# Patient Record
Sex: Female | Born: 1980 | Race: White | Hispanic: No | Marital: Married | Smoking: Never smoker
Health system: Southern US, Community
[De-identification: ages and names within clinical notes are randomized; demographics above are authoritative.]

## PROBLEM LIST (undated history)

## (undated) DIAGNOSIS — Z789 Other specified health status: Secondary | ICD-10-CM

## (undated) HISTORY — PX: GANGLION CYST EXCISION: SHX1691

---
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
## 2001-07-09 ENCOUNTER — Inpatient Hospital Stay (HOSPITAL_COMMUNITY): Admission: EM | Admit: 2001-07-09 | Discharge: 2001-07-11 | Payer: Self-pay | Admitting: *Deleted

## 2001-07-09 ENCOUNTER — Encounter: Payer: Self-pay | Admitting: Emergency Medicine

## 2001-08-28 ENCOUNTER — Encounter: Payer: Self-pay | Admitting: Emergency Medicine

## 2001-08-28 ENCOUNTER — Emergency Department (HOSPITAL_COMMUNITY): Admission: EM | Admit: 2001-08-28 | Discharge: 2001-08-28 | Payer: Self-pay | Admitting: Emergency Medicine

## 2001-09-29 ENCOUNTER — Emergency Department (HOSPITAL_COMMUNITY): Admission: EM | Admit: 2001-09-29 | Discharge: 2001-09-29 | Payer: Self-pay | Admitting: Emergency Medicine

## 2001-09-30 ENCOUNTER — Emergency Department (HOSPITAL_COMMUNITY): Admission: EM | Admit: 2001-09-30 | Discharge: 2001-09-30 | Payer: Self-pay | Admitting: Emergency Medicine

## 2001-10-11 ENCOUNTER — Emergency Department (HOSPITAL_COMMUNITY): Admission: EM | Admit: 2001-10-11 | Discharge: 2001-10-12 | Payer: Self-pay | Admitting: *Deleted

## 2002-09-04 ENCOUNTER — Other Ambulatory Visit: Admission: RE | Admit: 2002-09-04 | Discharge: 2002-09-04 | Payer: Self-pay | Admitting: Family Medicine

## 2002-11-22 ENCOUNTER — Other Ambulatory Visit: Admission: RE | Admit: 2002-11-22 | Discharge: 2002-11-22 | Payer: Self-pay | Admitting: Obstetrics and Gynecology

## 2002-12-04 ENCOUNTER — Other Ambulatory Visit: Admission: RE | Admit: 2002-12-04 | Discharge: 2002-12-04 | Payer: Self-pay | Admitting: Obstetrics and Gynecology

## 2003-09-13 ENCOUNTER — Encounter: Admission: RE | Admit: 2003-09-13 | Discharge: 2003-09-13 | Payer: Self-pay | Admitting: *Deleted

## 2004-12-07 ENCOUNTER — Other Ambulatory Visit: Admission: RE | Admit: 2004-12-07 | Discharge: 2004-12-07 | Payer: Self-pay | Admitting: Family Medicine

## 2005-05-27 ENCOUNTER — Encounter: Admission: RE | Admit: 2005-05-27 | Discharge: 2005-05-27 | Payer: Self-pay | Admitting: Family Medicine

## 2005-06-09 ENCOUNTER — Encounter: Admission: RE | Admit: 2005-06-09 | Discharge: 2005-06-09 | Payer: Self-pay | Admitting: Sports Medicine

## 2006-01-18 HISTORY — PX: FOOT SURGERY: SHX648

## 2006-06-23 ENCOUNTER — Encounter: Admission: RE | Admit: 2006-06-23 | Discharge: 2006-06-23 | Payer: Self-pay | Admitting: *Deleted

## 2006-10-15 ENCOUNTER — Emergency Department (HOSPITAL_COMMUNITY): Admission: EM | Admit: 2006-10-15 | Discharge: 2006-10-15 | Payer: Self-pay | Admitting: Emergency Medicine

## 2009-08-18 ENCOUNTER — Encounter: Admission: RE | Admit: 2009-08-18 | Discharge: 2009-08-18 | Payer: Self-pay | Admitting: Gastroenterology

## 2009-11-14 ENCOUNTER — Emergency Department (HOSPITAL_COMMUNITY)
Admission: EM | Admit: 2009-11-14 | Discharge: 2009-11-14 | Payer: Self-pay | Source: Home / Self Care | Admitting: Emergency Medicine

## 2010-01-18 NOTE — L&D Delivery Note (Signed)
Delivery Note At 6:16 AM a viable female was delivered via Vaginal, Spontaneous Delivery (Presentation: Left Occiput Anterior).  APGAR: 8, 9; weight 8 lb 8 oz (3856 g).   Placenta status: Intact, Spontaneous.  Cord: 3 vessels.  Anesthesia: Epidural  Episiotomy: None Lacerations: 1st degree at the introitus on the left. Suture Repair: 3.0 chromic Est. Blood Loss (mL): 300  Mom to postpartum.  Baby to nursery-stable.  Domique Clapper D 12/05/2010, 8:16 AM

## 2010-04-01 LAB — CBC
HCT: 38.2 % (ref 36.0–46.0)
MCH: 30.5 pg (ref 26.0–34.0)
MCV: 91.8 fL (ref 78.0–100.0)
RDW: 12.4 % (ref 11.5–15.5)

## 2010-04-01 LAB — URINALYSIS, ROUTINE W REFLEX MICROSCOPIC
Bilirubin Urine: NEGATIVE
Glucose, UA: NEGATIVE mg/dL
Hgb urine dipstick: NEGATIVE
Ketones, ur: NEGATIVE mg/dL
Protein, ur: NEGATIVE mg/dL
pH: 7 (ref 5.0–8.0)

## 2010-04-01 LAB — WET PREP, GENITAL
Trich, Wet Prep: NONE SEEN
Yeast Wet Prep HPF POC: NONE SEEN

## 2010-04-01 LAB — BASIC METABOLIC PANEL
CO2: 28 mEq/L (ref 19–32)
Chloride: 107 mEq/L (ref 96–112)
Potassium: 3.6 mEq/L (ref 3.5–5.1)
Sodium: 139 mEq/L (ref 135–145)

## 2010-04-01 LAB — GC/CHLAMYDIA PROBE AMP, GENITAL
Chlamydia, DNA Probe: NEGATIVE
GC Probe Amp, Genital: NEGATIVE

## 2010-04-01 LAB — DIFFERENTIAL
Basophils Absolute: 0 10*3/uL (ref 0.0–0.1)
Basophils Relative: 0 % (ref 0–1)
Eosinophils Absolute: 0.1 10*3/uL (ref 0.0–0.7)

## 2010-04-01 LAB — HCG, QUANTITATIVE, PREGNANCY: hCG, Beta Chain, Quant, S: <2 m[IU]/mL (ref <5)

## 2010-04-01 LAB — HCG, SERUM, QUALITATIVE: Preg, Serum: NEGATIVE

## 2010-04-01 LAB — POCT PREGNANCY, URINE: Preg Test, Ur: NEGATIVE

## 2010-04-11 ENCOUNTER — Inpatient Hospital Stay (HOSPITAL_COMMUNITY)
Admission: AD | Admit: 2010-04-11 | Discharge: 2010-04-11 | Disposition: A | Discharge status: Home / Self Care | Payer: BC Managed Care – PPO | Source: Ambulatory Visit | Attending: Obstetrics & Gynecology | Admitting: Obstetrics & Gynecology

## 2010-04-11 ENCOUNTER — Inpatient Hospital Stay (HOSPITAL_COMMUNITY): Payer: BC Managed Care – PPO

## 2010-04-11 DIAGNOSIS — O99891 Other specified diseases and conditions complicating pregnancy: Secondary | ICD-10-CM | POA: Insufficient documentation

## 2010-04-11 DIAGNOSIS — R109 Unspecified abdominal pain: Secondary | ICD-10-CM

## 2010-04-11 DIAGNOSIS — O9989 Other specified diseases and conditions complicating pregnancy, childbirth and the puerperium: Secondary | ICD-10-CM

## 2010-04-11 LAB — CBC
HCT: 34.4 % — ABNORMAL LOW (ref 36.0–46.0)
MCHC: 32.8 g/dL (ref 30.0–36.0)
MCV: 89.1 fL (ref 78.0–100.0)
Platelets: 241 10*3/uL (ref 150–400)
RDW: 13.7 % (ref 11.5–15.5)

## 2010-04-11 LAB — HCG, QUANTITATIVE, PREGNANCY: hCG, Beta Chain, Quant, S: 22682 m[IU]/mL — ABNORMAL HIGH (ref <5)

## 2010-04-11 LAB — URINALYSIS, ROUTINE W REFLEX MICROSCOPIC
Glucose, UA: NEGATIVE mg/dL
Hgb urine dipstick: NEGATIVE
Ketones, ur: NEGATIVE mg/dL
Protein, ur: NEGATIVE mg/dL
Specific Gravity, Urine: 1.02 (ref 1.005–1.030)

## 2010-04-11 LAB — POCT PREGNANCY, URINE: Preg Test, Ur: POSITIVE

## 2010-04-11 LAB — WET PREP, GENITAL: Clue Cells Wet Prep HPF POC: NONE SEEN

## 2010-04-14 LAB — GC/CHLAMYDIA PROBE AMP, GENITAL: Chlamydia, DNA Probe: NEGATIVE

## 2010-04-30 LAB — HIV ANTIBODY (ROUTINE TESTING W REFLEX): HIV: NONREACTIVE

## 2010-04-30 LAB — HEPATITIS B SURFACE ANTIGEN: Hepatitis B Surface Ag: NEGATIVE
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
## 2010-09-28 ENCOUNTER — Encounter (HOSPITAL_COMMUNITY): Payer: Self-pay | Admitting: *Deleted

## 2010-09-28 ENCOUNTER — Inpatient Hospital Stay (HOSPITAL_COMMUNITY)
Admission: AD | Admit: 2010-09-28 | Discharge: 2010-09-28 | Disposition: A | Discharge status: Home / Self Care | Payer: BC Managed Care – PPO | Source: Ambulatory Visit | Attending: Obstetrics & Gynecology | Admitting: Obstetrics & Gynecology

## 2010-09-28 DIAGNOSIS — IMO0002 Reserved for concepts with insufficient information to code with codable children: Secondary | ICD-10-CM

## 2010-09-28 DIAGNOSIS — O368390 Maternal care for abnormalities of the fetal heart rate or rhythm, unspecified trimester, not applicable or unspecified: Secondary | ICD-10-CM | POA: Insufficient documentation

## 2010-09-28 HISTORY — DX: Other specified health status: Z78.9

## 2010-09-28 NOTE — ED Provider Notes (Signed)
History   Pt presents today for NST. She is 29.5wks and was noted to have fetal tachycardia by doppler in the office. She states she is doing well and has no complaints. She denies abd pain, vag dc, bleeding, or any other problems at this time.  No chief complaint on file.  HPI  OB History    Grav Para Term Preterm Abortions TAB SAB Ect Mult Living   3    2 1 1          Past Medical History  Diagnosis Date  . No pertinent past medical history     Past Surgical History  Procedure Date  . Foot surgery 2008  . Ganglion cyst excision     No family history on file.  History  Substance Use Topics  . Smoking status: Never Smoker   . Smokeless tobacco: Not on file  . Alcohol Use: No    Allergies: No Known Allergies  Prescriptions prior to admission  Medication Sig Dispense Refill  . dexlansoprazole (DEXILANT) 60 MG capsule Take 60 mg by mouth daily.        . prenatal vitamin w/FE, FA (PRENATAL 1 + 1) 27-1 MG TABS Take 1 tablet by mouth daily.          Review of Systems  Constitutional: Negative for fever.  Cardiovascular: Negative for chest pain.  Gastrointestinal: Negative for nausea, vomiting, abdominal pain, diarrhea and constipation.  Genitourinary: Negative for dysuria, urgency, frequency and hematuria.  Neurological: Negative for dizziness and headaches.  Psychiatric/Behavioral: Negative for depression and suicidal ideas.   Physical Exam   Blood pressure 125/72, pulse 114, temperature 98.9 F (37.2 C), temperature source Oral, resp. rate 20, height 5\' 5"  (1.651 m), weight 180 lb (81.647 kg).  Physical Exam  Constitutional: She is oriented to person, place, and time. She appears well-developed and well-nourished. No distress.  HENT:  Head: Normocephalic and atraumatic.  Eyes: EOM are normal. Pupils are equal, round, and reactive to light.  GI: Soft. She exhibits no distension. There is no tenderness. There is no rebound and no guarding.  Neurological: She is  alert and oriented to person, place, and time.  Skin: Skin is warm and dry. She is not diaphoretic.  Psychiatric: She has a normal mood and affect. Her behavior is normal. Judgment and thought content normal.    MAU Course  Procedures  NST reactive with FHTs in the 150s. NL NST for 29wks.  Discussed pt with Dr. Aldona Bar. She has f/u scheduled in the office.  Assessment and Plan  Fetal tachycardia: no evidence of fetal tachycardia or maternal/fetal compromise. Will dc to home. She has f/u scheduled. Discussed diet, activity, risks, and precautions.  Clinton Gallant. Rice III, DrHSc, MPAS, PA-C  09/28/2010, 1:12 PM   Henrietta Hoover, PA 09/28/10 1315

## 2010-09-28 NOTE — Progress Notes (Signed)
Dr. Aldona Bar called to check on pt., FHR 150's.  Informed E. Rice PA has seen pt., plan for discharge.

## 2010-09-28 NOTE — Progress Notes (Signed)
Fetal tach when listened with doppler at office, this was a rtn visit

## 2010-11-29 ENCOUNTER — Encounter (HOSPITAL_COMMUNITY): Payer: Self-pay | Admitting: *Deleted

## 2010-11-29 ENCOUNTER — Observation Stay (HOSPITAL_COMMUNITY)
Admission: AD | Admit: 2010-11-29 | Discharge: 2010-11-29 | Disposition: A | Discharge status: Home / Self Care | Payer: BC Managed Care – PPO | Source: Ambulatory Visit | Attending: Obstetrics and Gynecology | Admitting: Obstetrics and Gynecology

## 2010-11-29 DIAGNOSIS — O479 False labor, unspecified: Principal | ICD-10-CM | POA: Insufficient documentation

## 2010-12-03 ENCOUNTER — Encounter (HOSPITAL_COMMUNITY): Payer: Self-pay | Admitting: *Deleted

## 2010-12-03 ENCOUNTER — Observation Stay (HOSPITAL_COMMUNITY)
Admission: AD | Admit: 2010-12-03 | Discharge: 2010-12-03 | Disposition: A | Discharge status: Home / Self Care | Payer: BC Managed Care – PPO | Source: Ambulatory Visit | Attending: Obstetrics and Gynecology | Admitting: Obstetrics and Gynecology

## 2010-12-03 DIAGNOSIS — O471 False labor at or after 37 completed weeks of gestation: Secondary | ICD-10-CM

## 2010-12-03 DIAGNOSIS — O479 False labor, unspecified: Principal | ICD-10-CM | POA: Insufficient documentation

## 2010-12-03 NOTE — Progress Notes (Signed)
MAU Triage Note  S: 30 yo G3P0020 presents at 39+1 weeks c/o large gush of fluid and then persistent dark brown mucoid vaginal discharge.  After the first gush of fluid the patients has had minimal additional leakage of fluid.  Denies contractions.  Active FM, no VB O: Filed Vitals:   12/03/10 1826 12/03/10 1858 12/03/10 1943  BP: 105/78    Pulse: 105    Temp: 97.8 F (36.6 C)    TempSrc: Oral    Resp: 20 18 18    AOx3, NAD Gravid soft NT NEFG Vagina rugated, no pool of fluid, scant mucoid D/C Cvx: FT-1/Th/-2, posterior FHT: 140-150 reactive with accelerations and no decelerations  WP: fern negative Amnisure: negative  A/P 30 yo @ 39+1 without evidence of SROM or active labor 1) D/C home 2) Labor precautions given 3) FWB reassuring 4) Keep next scheduled appointment in the office Toco: irregular

## 2010-12-04 ENCOUNTER — Encounter (HOSPITAL_COMMUNITY): Payer: Self-pay | Admitting: Anesthesiology

## 2010-12-04 ENCOUNTER — Inpatient Hospital Stay (HOSPITAL_COMMUNITY)
Admission: AD | Admit: 2010-12-04 | Discharge: 2010-12-06 | DRG: 373 | Disposition: A | Discharge status: Home / Self Care | Payer: BC Managed Care – PPO | Source: Ambulatory Visit | Attending: Obstetrics & Gynecology | Admitting: Obstetrics & Gynecology

## 2010-12-04 ENCOUNTER — Inpatient Hospital Stay (HOSPITAL_COMMUNITY): Payer: BC Managed Care – PPO | Admitting: Anesthesiology

## 2010-12-04 LAB — CBC
HCT: 42 % (ref 36.0–46.0)
MCH: 28.7 pg (ref 26.0–34.0)
MCV: 96.6 fL (ref 78.0–100.0)
Platelets: 225 10*3/uL (ref 150–400)
RBC: 4.35 MIL/uL (ref 3.87–5.11)

## 2010-12-04 MED ORDER — LACTATED RINGERS IV SOLN
INTRAVENOUS | Status: DC
  Administered 2010-12-04 – 2010-12-05 (×3): via INTRAVENOUS

## 2010-12-04 MED ORDER — FLEET ENEMA 7-19 GM/118ML RE ENEM: 1.0000 | ENEMA | RECTAL | Status: DC | PRN

## 2010-12-04 MED ORDER — CITRIC ACID-SODIUM CITRATE 334-500 MG/5ML PO SOLN
30.0000 mL | ORAL | Status: DC | PRN
  Administered 2010-12-04 – 2010-12-05 (×2): 30 mL via ORAL
  Filled 2010-12-04 (×2): qty 15

## 2010-12-04 MED ORDER — PHENYLEPHRINE 40 MCG/ML (10ML) SYRINGE FOR IV PUSH (FOR BLOOD PRESSURE SUPPORT): 80.0000 ug | PREFILLED_SYRINGE | INTRAVENOUS | Status: DC | PRN

## 2010-12-04 MED ORDER — IBUPROFEN 600 MG PO TABS
600.0000 mg | ORAL_TABLET | Freq: Four times a day (QID) | ORAL | Status: DC | PRN
  Administered 2010-12-05: 600 mg via ORAL
  Filled 2010-12-04: qty 1

## 2010-12-04 MED ORDER — ONDANSETRON HCL 4 MG/2ML IJ SOLN
4.0000 mg | Freq: Four times a day (QID) | INTRAMUSCULAR | Status: DC | PRN
  Administered 2010-12-05: 4 mg via INTRAVENOUS
  Filled 2010-12-04: qty 2

## 2010-12-04 MED ORDER — OXYCODONE-ACETAMINOPHEN 5-325 MG PO TABS
2.0000 | ORAL_TABLET | ORAL | Status: DC | PRN
  Administered 2010-12-05: 2 via ORAL
  Filled 2010-12-04: qty 2

## 2010-12-04 MED ORDER — PANTOPRAZOLE SODIUM 40 MG PO TBEC
80.0000 mg | DELAYED_RELEASE_TABLET | Freq: Every day | ORAL | Status: DC
  Administered 2010-12-04 – 2010-12-06 (×2): 80 mg via ORAL
  Filled 2010-12-04 (×4): qty 2

## 2010-12-04 MED ORDER — BUTORPHANOL TARTRATE 2 MG/ML IJ SOLN: 1.0000 mg | INTRAMUSCULAR | Status: DC | PRN

## 2010-12-04 MED ORDER — LIDOCAINE HCL (PF) 1 % IJ SOLN
30.0000 mL | INTRAMUSCULAR | Status: DC | PRN
  Filled 2010-12-04: qty 30

## 2010-12-04 MED ORDER — ACETAMINOPHEN 325 MG PO TABS: 650.0000 mg | ORAL_TABLET | ORAL | Status: DC | PRN

## 2010-12-04 MED ORDER — FENTANYL 2.5 MCG/ML BUPIVACAINE 1/10 % EPIDURAL INFUSION (WH - ANES)
14.0000 mL/h | INTRAMUSCULAR | Status: DC
  Administered 2010-12-04 – 2010-12-05 (×4): 14 mL/h via EPIDURAL
  Filled 2010-12-04 (×4): qty 60

## 2010-12-04 MED ORDER — LACTATED RINGERS IV SOLN: 500.0000 mL | INTRAVENOUS | Status: DC | PRN

## 2010-12-04 MED ORDER — LACTATED RINGERS IV SOLN: 500.0000 mL | Freq: Once | INTRAVENOUS | Status: DC

## 2010-12-04 MED ORDER — DIPHENHYDRAMINE HCL 50 MG/ML IJ SOLN: 12.5000 mg | INTRAMUSCULAR | Status: DC | PRN

## 2010-12-04 MED ORDER — OXYTOCIN 20 UNITS IN LACTATED RINGERS INFUSION - SIMPLE: 125.0000 mL/h | Freq: Once | INTRAVENOUS | Status: DC

## 2010-12-04 MED ORDER — EPHEDRINE 5 MG/ML INJ
10.0000 mg | INTRAVENOUS | Status: DC | PRN
  Filled 2010-12-04: qty 4

## 2010-12-04 MED ORDER — PHENYLEPHRINE 40 MCG/ML (10ML) SYRINGE FOR IV PUSH (FOR BLOOD PRESSURE SUPPORT)
80.0000 ug | PREFILLED_SYRINGE | INTRAVENOUS | Status: DC | PRN
  Filled 2010-12-04: qty 5

## 2010-12-04 MED ORDER — LIDOCAINE HCL 1.5 % IJ SOLN
INTRAMUSCULAR | Status: DC | PRN
  Administered 2010-12-04 (×2): 5 mL via INTRADERMAL
  Administered 2010-12-04: 2 mL via INTRADERMAL

## 2010-12-04 MED ORDER — OXYTOCIN BOLUS FROM INFUSION
500.0000 mL | Freq: Once | INTRAVENOUS | Status: AC
  Administered 2010-12-05: 1000 mL via INTRAVENOUS
  Filled 2010-12-04: qty 1000
  Filled 2010-12-04: qty 500

## 2010-12-04 MED ORDER — EPHEDRINE 5 MG/ML INJ: 10.0000 mg | INTRAVENOUS | Status: DC | PRN

## 2010-12-04 NOTE — H&P (Signed)
30 y.o. G3P0  Estimated Date of Delivery: 12/09/10 admitted at 39/[redacted] weeks gestation admitted in early labor. Prenatal course was uncomplicated. Prenatal labs: Blood Type:O+.  Screening tests for HIV, Syphilis, Hepatitis B, Rubella sensitivity, fetal anomalies, gestational diabetes, and perineal group B strep colonization were negative.    Afebrile, VSS Heart and Lungs: No active disease Abdomen: soft, gravid, EFW AGA. Cervical exam:  4/100  Impression: Term pregnancy in labor  Plan:  TOL

## 2010-12-04 NOTE — Anesthesia Preprocedure Evaluation (Signed)
Anesthesia Evaluation  Patient identified by MRN, date of birth, ID band Patient awake    Reviewed: Allergy & Precautions, H&P , NPO status , Patient's Chart, lab work & pertinent test results, reviewed documented beta blocker date and time   History of Anesthesia Complications Negative for: history of anesthetic complications  Airway Mallampati: I TM Distance: >3 FB Neck ROM: full    Dental  (+) Teeth Intact   Pulmonary neg pulmonary ROS,  clear to auscultation        Cardiovascular neg cardio ROS regular Normal    Neuro/Psych Negative Neurological ROS  Negative Psych ROS   GI/Hepatic Neg liver ROS, GERD-  ,  Endo/Other  Negative Endocrine ROS  Renal/GU negative Renal ROS  Genitourinary negative   Musculoskeletal   Abdominal   Peds  Hematology negative hematology ROS (+)   Anesthesia Other Findings   Reproductive/Obstetrics (+) Pregnancy                           Anesthesia Physical Anesthesia Plan  ASA: II  Anesthesia Plan: Epidural   Post-op Pain Management:    Induction:   Airway Management Planned:   Additional Equipment:   Intra-op Plan:   Post-operative Plan:   Informed Consent: I have reviewed the patients History and Physical, chart, labs and discussed the procedure including the risks, benefits and alternatives for the proposed anesthesia with the patient or authorized representative who has indicated his/her understanding and acceptance.     Plan Discussed with:   Anesthesia Plan Comments:         Anesthesia Quick Evaluation

## 2010-12-04 NOTE — Progress Notes (Signed)
Painful ctx's since 0200, now q 4-5 past couple hours,no bleeding.

## 2010-12-04 NOTE — Progress Notes (Signed)
Cx. 7-8, Vtx 0.  Good progress.  Not tracing UCs well.  IUPC placed.  Will start pitocin at 0200 if <9 cm.  Note by Dr. Remigio Eisenmenger is on the wrong patient.

## 2010-12-04 NOTE — Progress Notes (Signed)
AROM clear fluid. Cx 4-5/100%, -1.

## 2010-12-04 NOTE — Anesthesia Procedure Notes (Signed)
Epidural Patient location during procedure: OB Start time: 12/04/2010 6:30 PM Reason for block: procedure for pain  Staffing Performed by: anesthesiologist   Preanesthetic Checklist Completed: patient identified, site marked, surgical consent, pre-op evaluation, timeout performed, IV checked, risks and benefits discussed and monitors and equipment checked  Epidural Patient position: sitting Prep: site prepped and draped and DuraPrep Patient monitoring: continuous pulse ox and blood pressure Approach: midline Injection technique: LOR air  Needle:  Needle type: Tuohy  Needle gauge: 17 G Needle length: 9 cm Catheter type: closed end flexible Catheter size: 19 Gauge Test dose: negative  Assessment Events: blood not aspirated, injection not painful, no injection resistance, negative IV test and no paresthesia  Additional Notes Discussed risk of headache, infection, bleeding, nerve injury and failed or incomplete block.  Patient voices understanding and wishes to proceed.

## 2010-12-04 NOTE — Progress Notes (Signed)
Heidi Roberts is a 30 y.o. G3P0020 at [redacted]w[redacted]d by admitted for induction of labor due to Gestational diabetes.  Subjective: Resting comfortably with epidural.  On pitocin, MVUs 180 at last calculation  Objective: BP 117/76  Pulse 117  Temp(Src) 98.5 F (36.9 C) (Oral)  Resp 18  Ht 5\' 5"  (1.651 m)  Wt 85.73 kg (189 lb)  BMI 31.45 kg/m2  SpO2 100%      FHT:  FHR: 145 bpm, variability: moderate,  accelerations:  Present,  decelerations:  Absent UC:   regular, every 3-4 minutes SVE:   Dilation: 6.5 Effacement (%): 100 Station: +1 Exam by:: Montez Morita, RNC  Labs: Lab Results  Component Value Date   WBC 13.3* 12/04/2010   HGB 12.5 12/04/2010   HCT 42.0 12/04/2010   MCV 96.6 12/04/2010   PLT 225 12/04/2010    Assessment / Plan: Induction of labor for GDM.  Awaiting MVUs to be adequate at 200 and then will leave on pitocin for 2hours prior to re-check.  Expectant management.  Labor: as above Fetal Wellbeing:  Category I Pain Control:  Epidural Anticipated MOD:  Will reevaluat progress at next check  Drucie Ip 12/04/2010, 10:58 PM

## 2010-12-05 ENCOUNTER — Encounter (HOSPITAL_COMMUNITY): Payer: Self-pay | Admitting: *Deleted

## 2010-12-05 MED ORDER — ZOLPIDEM TARTRATE 5 MG PO TABS: 5.0000 mg | ORAL_TABLET | Freq: Every evening | ORAL | Status: DC | PRN

## 2010-12-05 MED ORDER — SIMETHICONE 80 MG PO CHEW
80.0000 mg | CHEWABLE_TABLET | ORAL | Status: DC | PRN
  Administered 2010-12-06: 80 mg via ORAL

## 2010-12-05 MED ORDER — SENNOSIDES-DOCUSATE SODIUM 8.6-50 MG PO TABS
2.0000 | ORAL_TABLET | Freq: Every day | ORAL | Status: DC
  Administered 2010-12-05: 2 via ORAL

## 2010-12-05 MED ORDER — PRENATAL PLUS 27-1 MG PO TABS
1.0000 | ORAL_TABLET | Freq: Every day | ORAL | Status: DC
  Administered 2010-12-05 – 2010-12-06 (×2): 1 via ORAL
  Filled 2010-12-05 (×2): qty 1

## 2010-12-05 MED ORDER — WITCH HAZEL-GLYCERIN EX PADS: 1.0000 "application " | MEDICATED_PAD | CUTANEOUS | Status: DC | PRN

## 2010-12-05 MED ORDER — IBUPROFEN 600 MG PO TABS
600.0000 mg | ORAL_TABLET | Freq: Four times a day (QID) | ORAL | Status: DC
  Administered 2010-12-05 – 2010-12-06 (×6): 600 mg via ORAL
  Filled 2010-12-05 (×6): qty 1

## 2010-12-05 MED ORDER — LANOLIN HYDROUS EX OINT: TOPICAL_OINTMENT | CUTANEOUS | Status: DC | PRN

## 2010-12-05 MED ORDER — ONDANSETRON HCL 4 MG PO TABS: 4.0000 mg | ORAL_TABLET | ORAL | Status: DC | PRN

## 2010-12-05 MED ORDER — DIBUCAINE 1 % RE OINT: 1.0000 "application " | TOPICAL_OINTMENT | RECTAL | Status: DC | PRN

## 2010-12-05 MED ORDER — ONDANSETRON HCL 4 MG/2ML IJ SOLN: 4.0000 mg | INTRAMUSCULAR | Status: DC | PRN

## 2010-12-05 MED ORDER — OXYCODONE-ACETAMINOPHEN 5-325 MG PO TABS
1.0000 | ORAL_TABLET | ORAL | Status: DC | PRN
  Administered 2010-12-05 (×2): 1 via ORAL
  Filled 2010-12-05 (×2): qty 1

## 2010-12-05 MED ORDER — TETANUS-DIPHTH-ACELL PERTUSSIS 5-2.5-18.5 LF-MCG/0.5 IM SUSP
0.5000 mL | Freq: Once | INTRAMUSCULAR | Status: AC
  Administered 2010-12-06: 0.5 mL via INTRAMUSCULAR
  Filled 2010-12-05: qty 0.5

## 2010-12-05 MED ORDER — BENZOCAINE-MENTHOL 20-0.5 % EX AERO: 1.0000 "application " | INHALATION_SPRAY | CUTANEOUS | Status: DC | PRN

## 2010-12-05 MED ORDER — DIPHENHYDRAMINE HCL 25 MG PO CAPS: 25.0000 mg | ORAL_CAPSULE | Freq: Four times a day (QID) | ORAL | Status: DC | PRN

## 2010-12-05 NOTE — Anesthesia Postprocedure Evaluation (Signed)
  Anesthesia Post-op Note  Patient: Heidi Roberts  Procedure(s) Performed: * No procedures listed *  Patient Location: Mother/Baby  Anesthesia Type: Epidural  Level of Consciousness: awake  Airway and Oxygen Therapy: Patient Spontanous Breathing  Post-op Pain: none  Post-op Assessment: Patient's Cardiovascular Status Stable, Respiratory Function Stable, Patent Airway, No signs of Nausea or vomiting and Pain level controlled  Post-op Vital Signs: stable  Complications: No apparent anesthesia complications

## 2010-12-06 LAB — CBC
MCH: 29.1 pg (ref 26.0–34.0)
MCHC: 33.9 g/dL (ref 30.0–36.0)
MCV: 85.8 fL (ref 78.0–100.0)
Platelets: 199 10*3/uL (ref 150–400)
RDW: 14.2 % (ref 11.5–15.5)

## 2010-12-06 NOTE — Progress Notes (Signed)
Post Partum Day 1 Subjective: no complaints  Objective: Blood pressure 100/58, pulse 87, temperature 98 F (36.7 C), temperature source Oral, resp. rate 20, height 5\' 5"  (1.651 m), weight 85.73 kg (189 lb), SpO2 97.00%, breastfeeding.  Physical Exam:  General: alert Lochia: appropriate Uterine Fundus: firm   Basename 12/06/10 0524 12/04/10 1732  HGB 10.2* 12.5  HCT 30.1* 42.0    Assessment/Plan: Plan for discharge tomorrow, instructions given and option discussed for early discharge later today if desired.   LOS: 2 days   Raylyn Carton D 12/06/2010, 11:06 AM

## 2010-12-06 NOTE — Discharge Summary (Signed)
Obstetric Discharge Summary Reason for Admission: onset of labor Prenatal Procedures: ultrasound Intrapartum Procedures: spontaneous vaginal delivery Postpartum Procedures: none Complications-Operative and Postpartum: 1st degree perineal laceration Hemoglobin  Date Value Range Status  12/06/2010 10.2* 12.0-15.0 (g/dL) Final     DELTA CHECK NOTED     REPEATED TO VERIFY     HCT  Date Value Range Status  12/06/2010 30.1* 36.0-46.0 (%) Final    Discharge Diagnoses: Term Pregnancy-delivered  Discharge Information: Date: 12/06/2010 Activity: pelvic rest Diet: routine Medications: PNV and Ibuprofen Condition: stable Instructions: refer to practice specific booklet Discharge to: home Follow-up Information    Follow up with [facility] in 5 weeks.   Contact information:   [address] [place] [address] [phone]          Newborn Data: Live born female  Birth Weight: 8 lb 8 oz (3856 g) APGAR: 8, 9  Home with mother.  Jamelle Goldston D 12/06/2010, 2:39 PM

## 2011-03-03 ENCOUNTER — Encounter (HOSPITAL_COMMUNITY)
Admission: RE | Admit: 2011-03-03 | Discharge: 2011-03-03 | Disposition: A | Discharge status: Home / Self Care | Payer: BC Managed Care – PPO | Source: Ambulatory Visit | Attending: Obstetrics and Gynecology | Admitting: Obstetrics and Gynecology

## 2011-03-03 DIAGNOSIS — O923 Agalactia: Secondary | ICD-10-CM | POA: Insufficient documentation

## 2011-04-02 ENCOUNTER — Encounter (HOSPITAL_COMMUNITY)
Admission: RE | Admit: 2011-04-02 | Discharge: 2011-04-02 | Disposition: A | Discharge status: Home / Self Care | Payer: BC Managed Care – PPO | Source: Ambulatory Visit | Attending: Obstetrics and Gynecology | Admitting: Obstetrics and Gynecology

## 2011-04-02 DIAGNOSIS — O923 Agalactia: Secondary | ICD-10-CM | POA: Insufficient documentation

## 2011-05-03 ENCOUNTER — Encounter (HOSPITAL_COMMUNITY)
Admission: RE | Admit: 2011-05-03 | Discharge: 2011-05-03 | Disposition: A | Discharge status: Home / Self Care | Payer: BC Managed Care – PPO | Source: Ambulatory Visit | Attending: Obstetrics and Gynecology | Admitting: Obstetrics and Gynecology

## 2011-05-03 DIAGNOSIS — O923 Agalactia: Secondary | ICD-10-CM | POA: Insufficient documentation

## 2011-06-03 ENCOUNTER — Encounter (HOSPITAL_COMMUNITY)
Admission: RE | Admit: 2011-06-03 | Discharge: 2011-06-03 | Disposition: A | Discharge status: Home / Self Care | Payer: BC Managed Care – PPO | Source: Ambulatory Visit | Attending: Obstetrics and Gynecology | Admitting: Obstetrics and Gynecology

## 2011-06-03 DIAGNOSIS — O923 Agalactia: Secondary | ICD-10-CM | POA: Insufficient documentation

## 2011-07-04 ENCOUNTER — Encounter (HOSPITAL_COMMUNITY)
Admission: RE | Admit: 2011-07-04 | Discharge: 2011-07-04 | Disposition: A | Discharge status: Home / Self Care | Payer: TRICARE For Life (TFL) | Source: Ambulatory Visit | Attending: Obstetrics and Gynecology | Admitting: Obstetrics and Gynecology

## 2011-07-04 DIAGNOSIS — O923 Agalactia: Secondary | ICD-10-CM | POA: Insufficient documentation

## 2011-10-24 IMAGING — RF DG ESOPHAGUS
17 of 20 series · 19 of 24 positions shown · non-contrast
Comparison: None.

CLINICAL DATA: Dysphagia

ESOPHOGRAM/BARIUM SWALLOW
TECHNIQUE: Combined double contrast and single contrast
examination performed using effervescent crystals, thick barium
liquid, and thin barium liquid.
Fluoroscopy time:  1.4 of minutes.

[Series 1: run · 1 of 1 slices shown (1 of 17)]
[im 1/1]
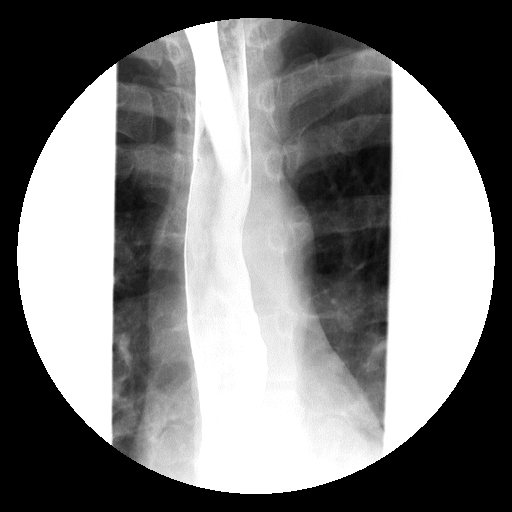

[Series 2: run · 1 of 1 slices shown (2 of 17)]
[im 1/1]
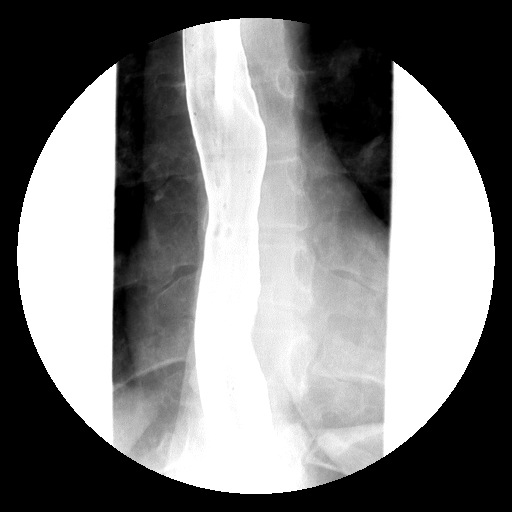

[Series 4: run · 1 of 1 slices shown (3 of 17)]
[im 1/1]
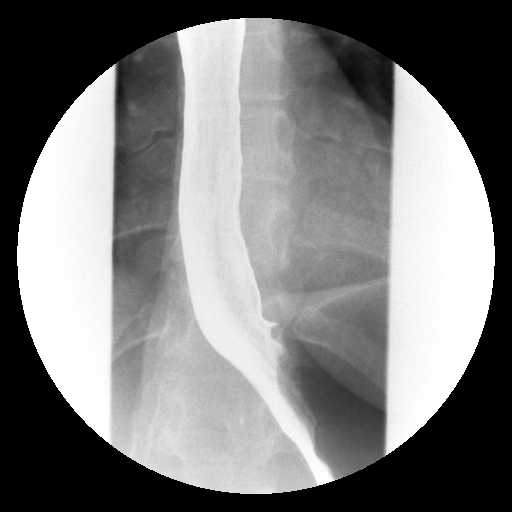

[Series 5: run · 1 of 1 slices shown (4 of 17)]
[im 1/1]
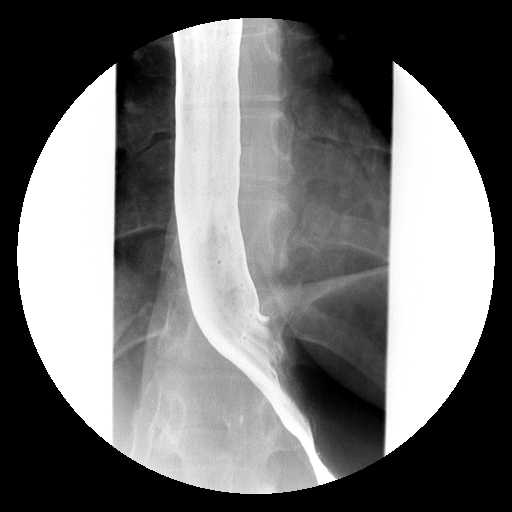

[Series 6: run · 1 of 1 slices shown (5 of 17)]
[im 1/1]
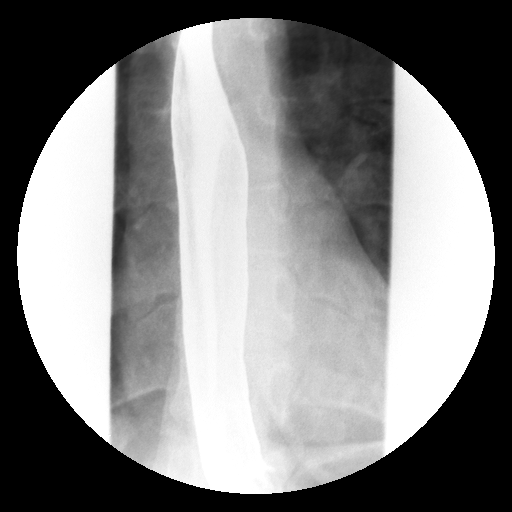

[Series 7: run · 1 of 1 slices shown (6 of 17)]
[im 1/1]
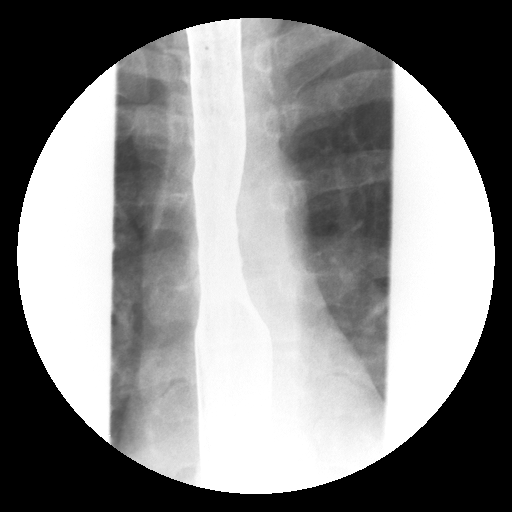

[Series 8: run · 2 of 11 slices shown (7 of 17)]
[im 6/11]
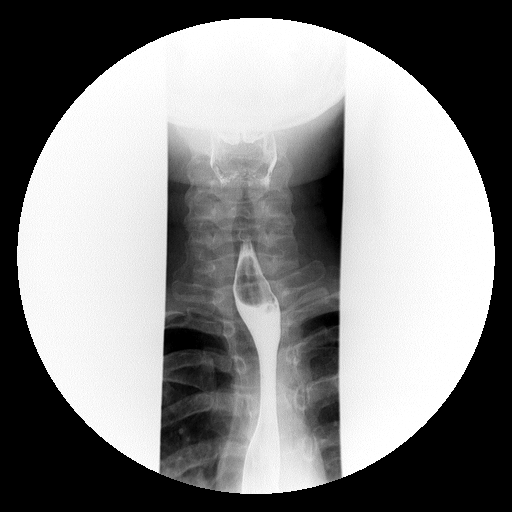
[im 11/11]
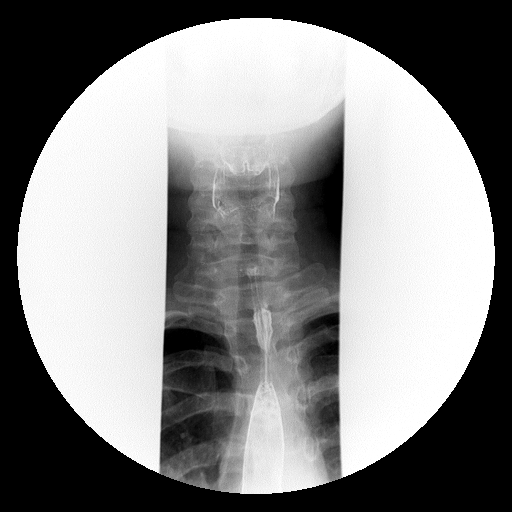

[Series 9: run · 2 of 11 slices shown (8 of 17)]
[im 1/11]
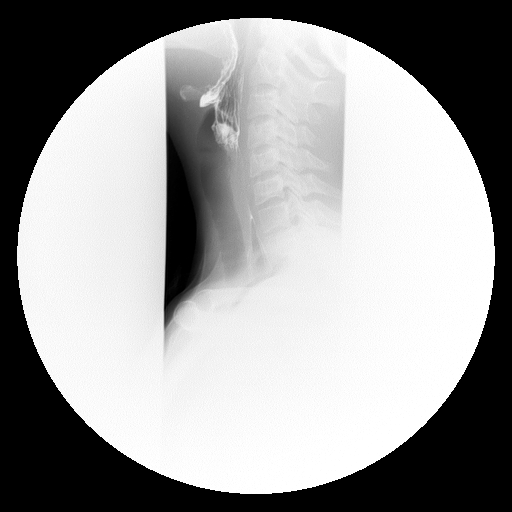
[im 11/11]
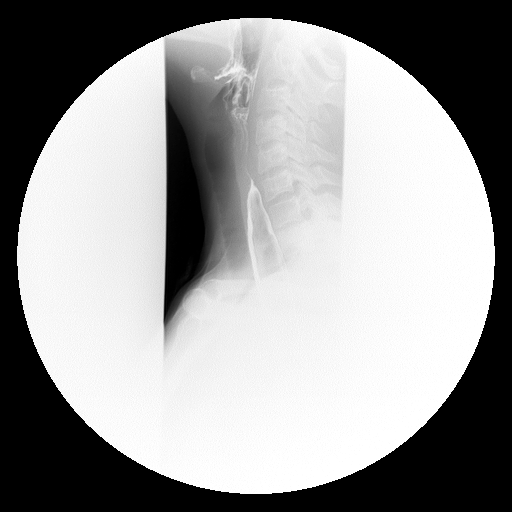

[Series 10: run · 1 of 1 slices shown (9 of 17)]
[im 1/1]
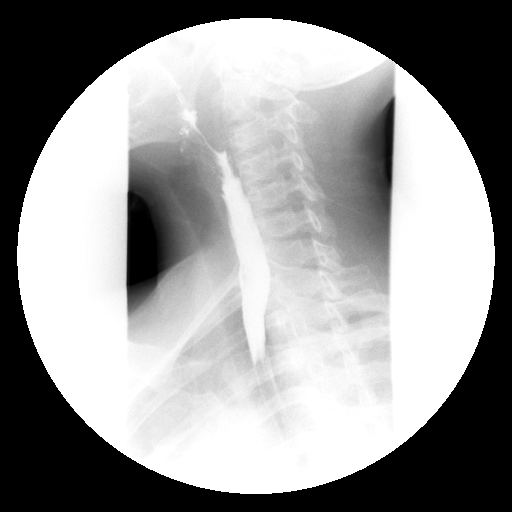

[Series 11: run · 1 of 1 slices shown (10 of 17)]
[im 1/1]
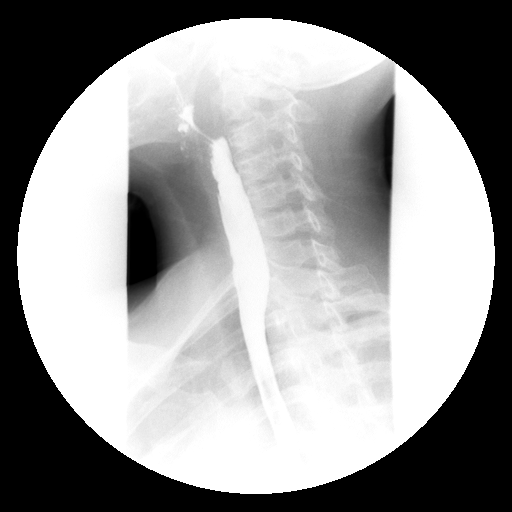

[Series 12: run · 1 of 1 slices shown (11 of 17)]
[im 1/1]
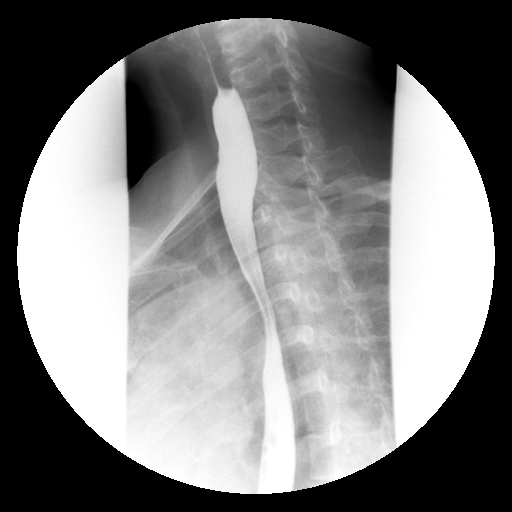

[Series 14: run · 1 of 1 slices shown (12 of 17)]
[im 1/1]
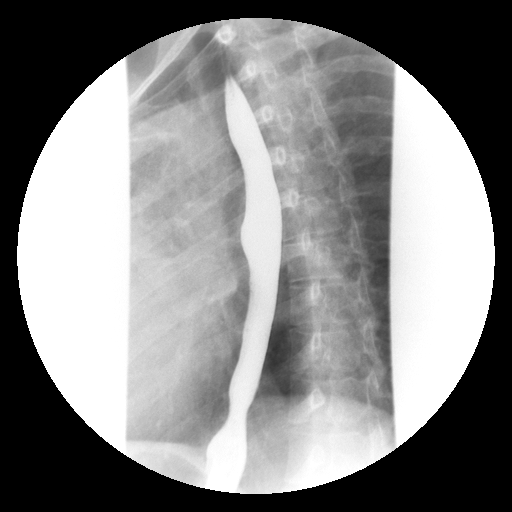

[Series 15: run · 1 of 1 slices shown (13 of 17)]
[im 1/1]
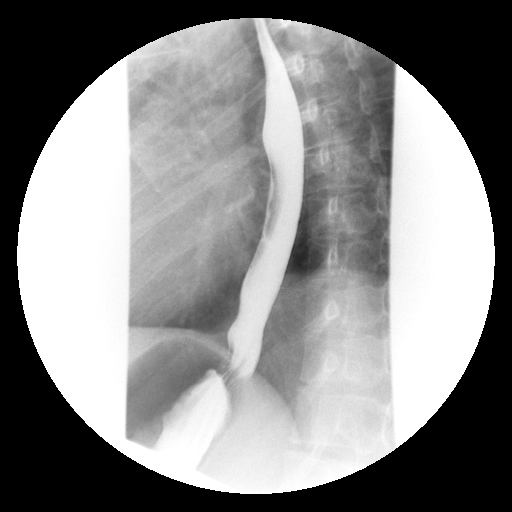

[Series 16: run · 1 of 1 slices shown (14 of 17)]
[im 1/1]
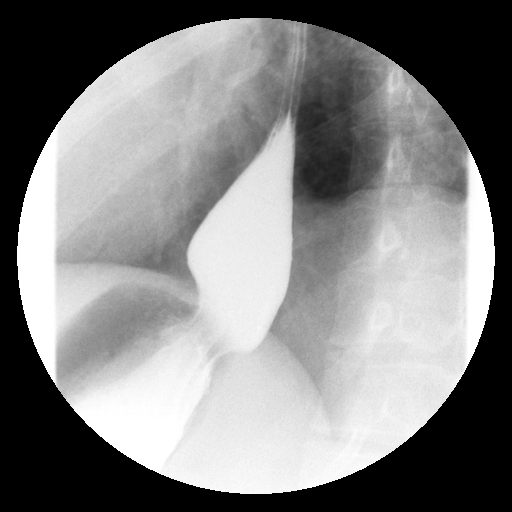

[Series 17: run · 1 of 1 slices shown (15 of 17)]
[im 1/1]
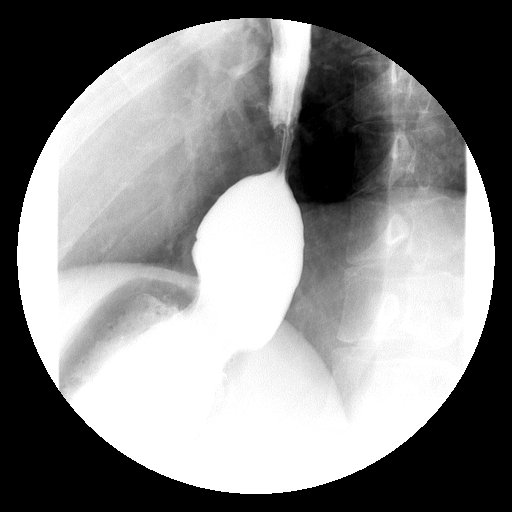

[Series 19: run · 1 of 1 slices shown (16 of 17)]
[im 1/1]
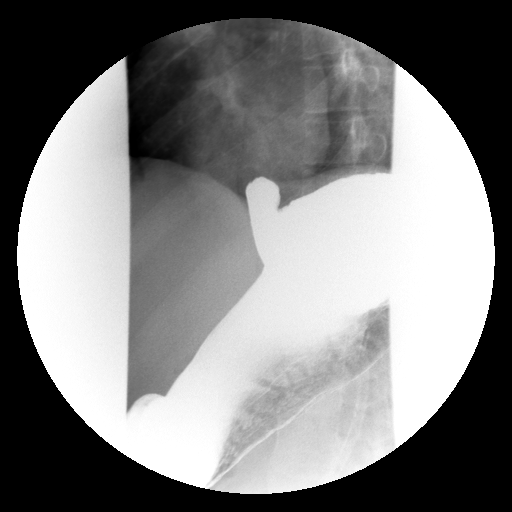

[Series 20: run · 1 of 1 slices shown (17 of 17)]
[im 1/1]
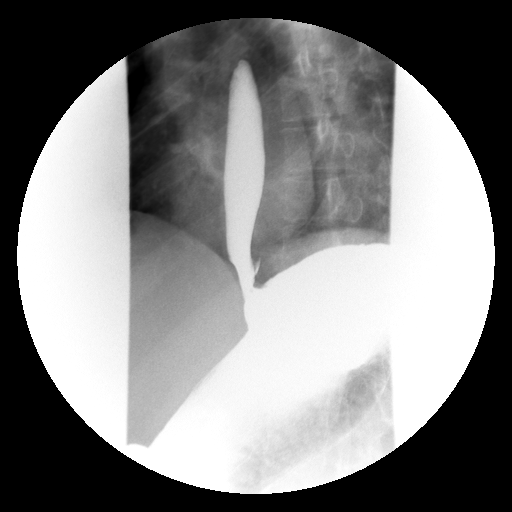

[19 of 24 positions shown; findings below may reference images not displayed]

FINDINGS: A double contrast barium swallow shows the mucosa of the
esophagus to be normal.  Single contrast study with rapid sequence
spot films show the cervical esophagus to be normal with normal
neuromuscular swallowing mechanism.  Esophageal peristalsis is
normal.  There is however a small hiatal hernia present.  Moderate
gastroesophageal reflux is noted.  A barium pill was given at the
end of the study which passed into the stomach without delay .
IMPRESSION: Small hiatal hernia with moderate gastroesophageal reflux.  Barium
pill passes into the stomach without delay.

## 2013-11-19 ENCOUNTER — Encounter (HOSPITAL_COMMUNITY): Payer: Self-pay | Admitting: *Deleted

## 2014-10-28 ENCOUNTER — Ambulatory Visit (INDEPENDENT_AMBULATORY_CARE_PROVIDER_SITE_OTHER): Payer: TRICARE For Life (TFL) | Admitting: Podiatry

## 2014-10-28 VITALS — BP 117/81 | HR 115 | Resp 16 | Ht 65.0 in | Wt 130.0 lb

## 2014-10-28 DIAGNOSIS — L600 Ingrowing nail: Secondary | ICD-10-CM

## 2014-10-28 NOTE — Progress Notes (Signed)
   Subjective:    Patient ID: Heidi Roberts, female    DOB: 1980-02-07, 34 y.o.   MRN: [id number]  HPI Patient presents with bilateral ingrown toenails, great toes and 2nd toes on both sides. This has been going on for the past 1-2 months.   Review of Systems  All other systems reviewed and are negative.      Objective:   Physical Exam        Assessment & Plan:

## 2014-10-28 NOTE — Patient Instructions (Signed)

## 2014-10-29 ENCOUNTER — Telehealth: Payer: Self-pay | Admitting: *Deleted

## 2014-10-29 NOTE — Progress Notes (Signed)
Subjective:     Patient ID: Heidi Roberts, female   DOB: 06/25/1980, 34 y.o.   MRN: [id number]  HPI patient states I've had mild problems with my big toes but the second toenails have been which really been bothering me and I cannot wear shoe gear comfortably and I have the corners removed previously and it's the entire nail that this time on the second toes.   Review of Systems  All other systems reviewed and are negative.      Objective:   Physical Exam  Constitutional: She is oriented to person, place, and time.  Cardiovascular: Intact distal pulses.   Musculoskeletal: Normal range of motion.  Neurological: She is oriented to person, place, and time.  Skin: Skin is warm.  Nursing note and vitals reviewed.  neurovascular status intact muscle strength adequate range of motion within normal limits with patient noted to have damaged thickened second nails bilateral that are painful when pressed and mild incurvation of the hallux nails that are minimally tender when pressed     Assessment:     Damaged second nails bilateral with thickness and pain along with mild ingrown toenail deformity of the hallux bilateral    Plan:     Reviewed both conditions and recommended removal of the second nails. Patient wants the surgery understanding this and understanding this will be a permanent procedure and risk that is associated with it. Patient wants surgery and today I infiltrated each second toe 60 mg I can Marcaine mixture removed the nails exposed matrix and applied phenol 5 applications 30 seconds followed by alcohol lavage and sterile dressing. Gave instructions on soaks and reappoint to recheck and may eventually need procedure on the big toes

## 2014-10-29 NOTE — Telephone Encounter (Signed)
Called patient at [phone] (Home #) to check to see how they were doing from their ingrown toenail procedure that was performed on Monday, October 28, 2014. Pt stated, "toe is still painful and throbbing". Pt has soaked toe with no relief. Pt has taken ibuprofen with no relief. I told patient to make sure they elevated their feet and not do too much activity. I also instructed patient if they wanted to do an extra soak on toes to go ahead and do that. Pt stated, "they are not wrapping toe too tightly". I also recommended patient to call back tomorrow if they are still having pain.

## 2014-10-31 ENCOUNTER — Telehealth: Payer: Self-pay | Admitting: *Deleted

## 2014-10-31 NOTE — Telephone Encounter (Addendum)
Pt states her toenail procedure site is very sore especially at night, she takes Ibuprofen 800mg  tid, without relief.  I informed pt she can continue the Ibuprofen and add Tylenol regular strength as directed on the package in between Ibuprofen, use epsom salt soaks and antibiotic ointment afterwards 2 times a day for 4-6 weeks.  I told her I would inform Dr. Charlsie Merlesegal and call again with further instructions or change of medication.  Left message informing pt I was calling to check her status, to call again with concerns.

## 2014-11-01 NOTE — Telephone Encounter (Signed)
When did she have it done? She could have vicodin if she needs something for pain
